# Patient Record
Sex: Female | Born: 1975 | Race: White | Hispanic: No | Marital: Single | Smoking: Former smoker
Health system: Southern US, Community
[De-identification: ages and names within clinical notes are randomized; demographics above are authoritative.]

## PROBLEM LIST (undated history)

## (undated) DIAGNOSIS — I10 Essential (primary) hypertension: Secondary | ICD-10-CM

## (undated) DIAGNOSIS — E079 Disorder of thyroid, unspecified: Secondary | ICD-10-CM

---

## 2003-01-18 ENCOUNTER — Ambulatory Visit (HOSPITAL_COMMUNITY): Admission: RE | Admit: 2003-01-18 | Discharge: 2003-01-18 | Payer: Self-pay | Admitting: Family Medicine

## 2003-12-06 ENCOUNTER — Ambulatory Visit (HOSPITAL_COMMUNITY): Admission: RE | Admit: 2003-12-06 | Discharge: 2003-12-06 | Payer: Self-pay | Admitting: Family Medicine

## 2009-02-21 ENCOUNTER — Emergency Department (HOSPITAL_COMMUNITY): Admission: EM | Admit: 2009-02-21 | Discharge: 2009-02-21 | Payer: Self-pay | Admitting: Emergency Medicine

## 2010-04-28 ENCOUNTER — Other Ambulatory Visit (HOSPITAL_COMMUNITY): Payer: Self-pay | Admitting: Otolaryngology

## 2010-04-28 ENCOUNTER — Ambulatory Visit (HOSPITAL_COMMUNITY)
Admission: RE | Admit: 2010-04-28 | Discharge: 2010-04-28 | Disposition: A | Discharge status: Home / Self Care | Payer: BC Managed Care – PPO | Source: Ambulatory Visit | Attending: Otolaryngology | Admitting: Otolaryngology

## 2010-04-28 DIAGNOSIS — R51 Headache: Secondary | ICD-10-CM

## 2010-04-28 DIAGNOSIS — R42 Dizziness and giddiness: Secondary | ICD-10-CM | POA: Insufficient documentation

## 2010-04-28 DIAGNOSIS — J392 Other diseases of pharynx: Secondary | ICD-10-CM | POA: Insufficient documentation

## 2010-04-28 DIAGNOSIS — R5381 Other malaise: Secondary | ICD-10-CM | POA: Insufficient documentation

## 2010-04-28 MED ORDER — GADOBENATE DIMEGLUMINE 529 MG/ML IV SOLN: 15.0000 mL | Freq: Once | INTRAVENOUS | Status: AC | PRN

## 2010-06-24 LAB — COMPREHENSIVE METABOLIC PANEL
ALT: 15 U/L (ref 0–35)
AST: 16 U/L (ref 0–37)
Albumin: 3.9 g/dL (ref 3.5–5.2)
Alkaline Phosphatase: 59 U/L (ref 39–117)
BUN: 10 mg/dL (ref 6–23)
CO2: 26 mEq/L (ref 19–32)
Calcium: 8.8 mg/dL (ref 8.4–10.5)
Chloride: 104 mEq/L (ref 96–112)
Creatinine, Ser: 0.98 mg/dL (ref 0.4–1.2)
GFR calc Af Amer: >60 mL/min (ref >60)
GFR calc non Af Amer: >60 mL/min (ref >60)
Glucose, Bld: 110 mg/dL — ABNORMAL HIGH (ref 70–99)
Potassium: 3.2 mEq/L — ABNORMAL LOW (ref 3.5–5.1)
Sodium: 141 mEq/L (ref 135–145)
Total Bilirubin: 0.2 mg/dL — ABNORMAL LOW (ref 0.3–1.2)
Total Protein: 7.4 g/dL (ref 6.0–8.3)

## 2010-06-24 LAB — DIFFERENTIAL
Basophils Absolute: 0.1 10*3/uL (ref 0.0–0.1)
Basophils Relative: 1 % (ref 0–1)
Eosinophils Absolute: 0.2 10*3/uL (ref 0.0–0.7)
Eosinophils Relative: 2 % (ref 0–5)
Lymphocytes Relative: 25 % (ref 12–46)
Lymphs Abs: 2.4 10*3/uL (ref 0.7–4.0)
Monocytes Absolute: 0.5 10*3/uL (ref 0.1–1.0)
Monocytes Relative: 5 % (ref 3–12)
Neutro Abs: 6.5 10*3/uL (ref 1.7–7.7)
Neutrophils Relative %: 67 % (ref 43–77)

## 2010-06-24 LAB — CBC
HCT: 38.1 % (ref 36.0–46.0)
Hemoglobin: 12.6 g/dL (ref 12.0–15.0)
MCHC: 33.2 g/dL (ref 30.0–36.0)
MCV: 81.5 fL (ref 78.0–100.0)
Platelets: 367 10*3/uL (ref 150–400)
RBC: 4.68 MIL/uL (ref 3.87–5.11)
RDW: 15.5 % (ref 11.5–15.5)
WBC: 9.7 10*3/uL (ref 4.0–10.5)

## 2010-06-24 LAB — URINALYSIS, ROUTINE W REFLEX MICROSCOPIC
Bilirubin Urine: NEGATIVE
Glucose, UA: NEGATIVE mg/dL
Hgb urine dipstick: NEGATIVE
Ketones, ur: NEGATIVE mg/dL
Nitrite: NEGATIVE
Protein, ur: NEGATIVE mg/dL
Specific Gravity, Urine: 1.013 (ref 1.005–1.030)
Urobilinogen, UA: 0.2 mg/dL (ref 0.0–1.0)
pH: 6 (ref 5.0–8.0)

## 2010-06-24 LAB — POCT CARDIAC MARKERS
CKMB, poc: <1 ng/mL — ABNORMAL LOW (ref 1.0–8.0)
Myoglobin, poc: 60.7 ng/mL (ref 12–200)
Troponin i, poc: <0.05 ng/mL (ref 0.00–0.09)

## 2010-06-24 LAB — POCT PREGNANCY, URINE: Preg Test, Ur: NEGATIVE

## 2010-06-24 LAB — LIPASE, BLOOD: Lipase: 35 U/L (ref 11–59)

## 2010-07-18 ENCOUNTER — Other Ambulatory Visit (HOSPITAL_COMMUNITY): Payer: Self-pay | Admitting: Family Medicine

## 2010-07-18 DIAGNOSIS — N760 Acute vaginitis: Secondary | ICD-10-CM

## 2010-07-18 DIAGNOSIS — N926 Irregular menstruation, unspecified: Secondary | ICD-10-CM

## 2010-07-22 ENCOUNTER — Ambulatory Visit (HOSPITAL_COMMUNITY): Admission: RE | Admit: 2010-07-22 | Payer: BC Managed Care – PPO | Source: Ambulatory Visit

## 2010-07-22 ENCOUNTER — Ambulatory Visit (HOSPITAL_COMMUNITY): Payer: BC Managed Care – PPO

## 2015-02-26 ENCOUNTER — Other Ambulatory Visit: Payer: Self-pay | Admitting: Obstetrics and Gynecology

## 2015-02-26 DIAGNOSIS — R928 Other abnormal and inconclusive findings on diagnostic imaging of breast: Secondary | ICD-10-CM

## 2015-03-04 ENCOUNTER — Ambulatory Visit
Admission: RE | Admit: 2015-03-04 | Discharge: 2015-03-04 | Disposition: A | Discharge status: Home / Self Care | Payer: BLUE CROSS/BLUE SHIELD | Source: Ambulatory Visit | Attending: Obstetrics and Gynecology | Admitting: Obstetrics and Gynecology

## 2015-03-04 ENCOUNTER — Other Ambulatory Visit: Payer: Self-pay | Admitting: Obstetrics and Gynecology

## 2015-03-04 DIAGNOSIS — R928 Other abnormal and inconclusive findings on diagnostic imaging of breast: Secondary | ICD-10-CM

## 2015-03-05 ENCOUNTER — Other Ambulatory Visit: Payer: Self-pay | Admitting: Obstetrics and Gynecology

## 2015-03-05 DIAGNOSIS — R928 Other abnormal and inconclusive findings on diagnostic imaging of breast: Secondary | ICD-10-CM

## 2015-03-07 ENCOUNTER — Ambulatory Visit
Admission: RE | Admit: 2015-03-07 | Discharge: 2015-03-07 | Disposition: A | Discharge status: Home / Self Care | Payer: BLUE CROSS/BLUE SHIELD | Source: Ambulatory Visit | Attending: Obstetrics and Gynecology | Admitting: Obstetrics and Gynecology

## 2015-03-07 DIAGNOSIS — R928 Other abnormal and inconclusive findings on diagnostic imaging of breast: Secondary | ICD-10-CM

## 2015-03-14 ENCOUNTER — Other Ambulatory Visit: Payer: BLUE CROSS/BLUE SHIELD

## 2015-12-02 ENCOUNTER — Other Ambulatory Visit: Payer: Self-pay | Admitting: Obstetrics and Gynecology

## 2015-12-02 DIAGNOSIS — N6320 Unspecified lump in the left breast, unspecified quadrant: Secondary | ICD-10-CM

## 2015-12-10 ENCOUNTER — Ambulatory Visit
Admission: RE | Admit: 2015-12-10 | Discharge: 2015-12-10 | Disposition: A | Discharge status: Home / Self Care | Payer: BLUE CROSS/BLUE SHIELD | Source: Ambulatory Visit | Attending: Obstetrics and Gynecology | Admitting: Obstetrics and Gynecology

## 2015-12-10 DIAGNOSIS — N6320 Unspecified lump in the left breast, unspecified quadrant: Secondary | ICD-10-CM

## 2018-06-27 DIAGNOSIS — J329 Chronic sinusitis, unspecified: Secondary | ICD-10-CM | POA: Diagnosis not present

## 2018-06-27 DIAGNOSIS — E663 Overweight: Secondary | ICD-10-CM | POA: Diagnosis not present

## 2018-06-27 DIAGNOSIS — Z6827 Body mass index (BMI) 27.0-27.9, adult: Secondary | ICD-10-CM | POA: Diagnosis not present

## 2018-07-06 DIAGNOSIS — Z23 Encounter for immunization: Secondary | ICD-10-CM | POA: Diagnosis not present

## 2018-07-06 DIAGNOSIS — R7989 Other specified abnormal findings of blood chemistry: Secondary | ICD-10-CM | POA: Diagnosis not present

## 2018-07-06 DIAGNOSIS — Z01419 Encounter for gynecological examination (general) (routine) without abnormal findings: Secondary | ICD-10-CM | POA: Diagnosis not present

## 2018-07-06 DIAGNOSIS — Z6829 Body mass index (BMI) 29.0-29.9, adult: Secondary | ICD-10-CM | POA: Diagnosis not present

## 2019-07-01 DIAGNOSIS — U071 COVID-19: Secondary | ICD-10-CM | POA: Diagnosis not present

## 2019-07-01 DIAGNOSIS — Z20822 Contact with and (suspected) exposure to covid-19: Secondary | ICD-10-CM | POA: Diagnosis not present

## 2019-07-26 DIAGNOSIS — L920 Granuloma annulare: Secondary | ICD-10-CM | POA: Diagnosis not present

## 2019-07-26 DIAGNOSIS — C44519 Basal cell carcinoma of skin of other part of trunk: Secondary | ICD-10-CM | POA: Diagnosis not present

## 2019-08-29 DIAGNOSIS — Z1231 Encounter for screening mammogram for malignant neoplasm of breast: Secondary | ICD-10-CM | POA: Diagnosis not present

## 2019-08-29 DIAGNOSIS — N915 Oligomenorrhea, unspecified: Secondary | ICD-10-CM | POA: Diagnosis not present

## 2019-08-29 DIAGNOSIS — Z01419 Encounter for gynecological examination (general) (routine) without abnormal findings: Secondary | ICD-10-CM | POA: Diagnosis not present

## 2019-08-29 DIAGNOSIS — R7989 Other specified abnormal findings of blood chemistry: Secondary | ICD-10-CM | POA: Diagnosis not present

## 2019-08-29 DIAGNOSIS — Z6829 Body mass index (BMI) 29.0-29.9, adult: Secondary | ICD-10-CM | POA: Diagnosis not present

## 2019-08-29 DIAGNOSIS — E039 Hypothyroidism, unspecified: Secondary | ICD-10-CM | POA: Diagnosis not present

## 2019-12-28 DIAGNOSIS — Z85828 Personal history of other malignant neoplasm of skin: Secondary | ICD-10-CM | POA: Diagnosis not present

## 2019-12-28 DIAGNOSIS — L920 Granuloma annulare: Secondary | ICD-10-CM | POA: Diagnosis not present

## 2019-12-28 DIAGNOSIS — Z08 Encounter for follow-up examination after completed treatment for malignant neoplasm: Secondary | ICD-10-CM | POA: Diagnosis not present

## 2020-02-21 DIAGNOSIS — M7731 Calcaneal spur, right foot: Secondary | ICD-10-CM | POA: Diagnosis not present

## 2020-02-21 DIAGNOSIS — M7732 Calcaneal spur, left foot: Secondary | ICD-10-CM | POA: Diagnosis not present

## 2020-02-21 DIAGNOSIS — M722 Plantar fascial fibromatosis: Secondary | ICD-10-CM | POA: Diagnosis not present

## 2020-02-28 DIAGNOSIS — M722 Plantar fascial fibromatosis: Secondary | ICD-10-CM | POA: Diagnosis not present

## 2020-03-06 DIAGNOSIS — M71572 Other bursitis, not elsewhere classified, left ankle and foot: Secondary | ICD-10-CM | POA: Diagnosis not present

## 2020-03-06 DIAGNOSIS — M71571 Other bursitis, not elsewhere classified, right ankle and foot: Secondary | ICD-10-CM | POA: Diagnosis not present

## 2020-03-06 DIAGNOSIS — M722 Plantar fascial fibromatosis: Secondary | ICD-10-CM | POA: Diagnosis not present

## 2020-03-11 DIAGNOSIS — R519 Headache, unspecified: Secondary | ICD-10-CM | POA: Diagnosis not present

## 2020-03-11 DIAGNOSIS — R0981 Nasal congestion: Secondary | ICD-10-CM | POA: Diagnosis not present

## 2020-03-11 DIAGNOSIS — Z20822 Contact with and (suspected) exposure to covid-19: Secondary | ICD-10-CM | POA: Diagnosis not present

## 2020-03-11 DIAGNOSIS — R42 Dizziness and giddiness: Secondary | ICD-10-CM | POA: Diagnosis not present

## 2020-04-03 DIAGNOSIS — Z03818 Encounter for observation for suspected exposure to other biological agents ruled out: Secondary | ICD-10-CM | POA: Diagnosis not present

## 2020-04-11 DIAGNOSIS — J069 Acute upper respiratory infection, unspecified: Secondary | ICD-10-CM | POA: Diagnosis not present

## 2020-04-18 DIAGNOSIS — Z03818 Encounter for observation for suspected exposure to other biological agents ruled out: Secondary | ICD-10-CM | POA: Diagnosis not present

## 2020-04-23 DIAGNOSIS — Z03818 Encounter for observation for suspected exposure to other biological agents ruled out: Secondary | ICD-10-CM | POA: Diagnosis not present

## 2020-06-13 DIAGNOSIS — L578 Other skin changes due to chronic exposure to nonionizing radiation: Secondary | ICD-10-CM | POA: Diagnosis not present

## 2020-06-13 DIAGNOSIS — D485 Neoplasm of uncertain behavior of skin: Secondary | ICD-10-CM | POA: Diagnosis not present

## 2020-06-13 DIAGNOSIS — D2262 Melanocytic nevi of left upper limb, including shoulder: Secondary | ICD-10-CM | POA: Diagnosis not present

## 2020-06-13 DIAGNOSIS — L818 Other specified disorders of pigmentation: Secondary | ICD-10-CM | POA: Diagnosis not present

## 2020-06-13 DIAGNOSIS — L308 Other specified dermatitis: Secondary | ICD-10-CM | POA: Diagnosis not present

## 2020-07-11 DIAGNOSIS — D485 Neoplasm of uncertain behavior of skin: Secondary | ICD-10-CM | POA: Diagnosis not present

## 2020-09-11 DIAGNOSIS — N920 Excessive and frequent menstruation with regular cycle: Secondary | ICD-10-CM | POA: Insufficient documentation

## 2020-09-13 DIAGNOSIS — E038 Other specified hypothyroidism: Secondary | ICD-10-CM | POA: Insufficient documentation

## 2020-09-13 DIAGNOSIS — Z1231 Encounter for screening mammogram for malignant neoplasm of breast: Secondary | ICD-10-CM | POA: Diagnosis not present

## 2020-09-13 DIAGNOSIS — Z6831 Body mass index (BMI) 31.0-31.9, adult: Secondary | ICD-10-CM | POA: Diagnosis not present

## 2020-09-13 DIAGNOSIS — N959 Unspecified menopausal and perimenopausal disorder: Secondary | ICD-10-CM | POA: Diagnosis not present

## 2020-09-13 DIAGNOSIS — Z01419 Encounter for gynecological examination (general) (routine) without abnormal findings: Secondary | ICD-10-CM | POA: Diagnosis not present

## 2020-09-13 DIAGNOSIS — N951 Menopausal and female climacteric states: Secondary | ICD-10-CM | POA: Insufficient documentation

## 2020-09-13 DIAGNOSIS — E039 Hypothyroidism, unspecified: Secondary | ICD-10-CM | POA: Diagnosis not present

## 2020-09-13 DIAGNOSIS — Z124 Encounter for screening for malignant neoplasm of cervix: Secondary | ICD-10-CM | POA: Diagnosis not present

## 2020-09-13 DIAGNOSIS — Z01411 Encounter for gynecological examination (general) (routine) with abnormal findings: Secondary | ICD-10-CM | POA: Diagnosis not present

## 2020-09-26 DIAGNOSIS — Z1212 Encounter for screening for malignant neoplasm of rectum: Secondary | ICD-10-CM | POA: Diagnosis not present

## 2020-09-26 DIAGNOSIS — Z1211 Encounter for screening for malignant neoplasm of colon: Secondary | ICD-10-CM | POA: Diagnosis not present

## 2020-10-06 LAB — EXTERNAL GENERIC LAB PROCEDURE: COLOGUARD: NEGATIVE

## 2020-10-16 DIAGNOSIS — Z20828 Contact with and (suspected) exposure to other viral communicable diseases: Secondary | ICD-10-CM | POA: Diagnosis not present

## 2020-10-16 DIAGNOSIS — I10 Essential (primary) hypertension: Secondary | ICD-10-CM | POA: Diagnosis not present

## 2020-10-16 DIAGNOSIS — Z681 Body mass index (BMI) 19 or less, adult: Secondary | ICD-10-CM | POA: Diagnosis not present

## 2020-10-16 DIAGNOSIS — J019 Acute sinusitis, unspecified: Secondary | ICD-10-CM | POA: Diagnosis not present

## 2020-10-30 DIAGNOSIS — E038 Other specified hypothyroidism: Secondary | ICD-10-CM | POA: Diagnosis not present

## 2020-10-30 DIAGNOSIS — I10 Essential (primary) hypertension: Secondary | ICD-10-CM | POA: Diagnosis not present

## 2020-10-30 DIAGNOSIS — Z13220 Encounter for screening for lipoid disorders: Secondary | ICD-10-CM | POA: Diagnosis not present

## 2020-10-30 DIAGNOSIS — Z720 Tobacco use: Secondary | ICD-10-CM | POA: Diagnosis not present

## 2020-10-30 DIAGNOSIS — Z1389 Encounter for screening for other disorder: Secondary | ICD-10-CM | POA: Diagnosis not present

## 2020-10-30 DIAGNOSIS — Z0001 Encounter for general adult medical examination with abnormal findings: Secondary | ICD-10-CM | POA: Diagnosis not present

## 2020-11-05 DIAGNOSIS — I10 Essential (primary) hypertension: Secondary | ICD-10-CM | POA: Diagnosis not present

## 2020-12-29 ENCOUNTER — Other Ambulatory Visit: Payer: Self-pay

## 2020-12-29 ENCOUNTER — Emergency Department (HOSPITAL_COMMUNITY)
Admission: EM | Admit: 2020-12-29 | Discharge: 2020-12-29 | Disposition: A | Discharge status: Home / Self Care | Payer: BC Managed Care – PPO | Attending: Emergency Medicine | Admitting: Emergency Medicine

## 2020-12-29 ENCOUNTER — Emergency Department (HOSPITAL_COMMUNITY): Payer: BC Managed Care – PPO

## 2020-12-29 ENCOUNTER — Encounter (HOSPITAL_COMMUNITY): Payer: Self-pay

## 2020-12-29 DIAGNOSIS — S0003XA Contusion of scalp, initial encounter: Secondary | ICD-10-CM | POA: Insufficient documentation

## 2020-12-29 DIAGNOSIS — N9489 Other specified conditions associated with female genital organs and menstrual cycle: Secondary | ICD-10-CM | POA: Insufficient documentation

## 2020-12-29 DIAGNOSIS — F17210 Nicotine dependence, cigarettes, uncomplicated: Secondary | ICD-10-CM | POA: Insufficient documentation

## 2020-12-29 DIAGNOSIS — Y92009 Unspecified place in unspecified non-institutional (private) residence as the place of occurrence of the external cause: Secondary | ICD-10-CM | POA: Diagnosis not present

## 2020-12-29 DIAGNOSIS — E876 Hypokalemia: Secondary | ICD-10-CM | POA: Diagnosis not present

## 2020-12-29 DIAGNOSIS — W1839XA Other fall on same level, initial encounter: Secondary | ICD-10-CM | POA: Diagnosis not present

## 2020-12-29 DIAGNOSIS — R55 Syncope and collapse: Secondary | ICD-10-CM | POA: Diagnosis not present

## 2020-12-29 DIAGNOSIS — S069X9A Unspecified intracranial injury with loss of consciousness of unspecified duration, initial encounter: Secondary | ICD-10-CM | POA: Diagnosis not present

## 2020-12-29 HISTORY — DX: Essential (primary) hypertension: I10

## 2020-12-29 HISTORY — DX: Disorder of thyroid, unspecified: E07.9

## 2020-12-29 LAB — CBC WITH DIFFERENTIAL/PLATELET
Abs Immature Granulocytes: 0.05 10*3/uL (ref 0.00–0.07)
Basophils Absolute: 0.1 10*3/uL (ref 0.0–0.1)
Basophils Relative: 1 %
Eosinophils Absolute: 0.5 10*3/uL (ref 0.0–0.5)
Eosinophils Relative: 4 %
HCT: 46.4 % — ABNORMAL HIGH (ref 36.0–46.0)
Hemoglobin: 15.8 g/dL — ABNORMAL HIGH (ref 12.0–15.0)
Immature Granulocytes: 0 %
Lymphocytes Relative: 21 %
Lymphs Abs: 3 10*3/uL (ref 0.7–4.0)
MCH: 31.5 pg (ref 26.0–34.0)
MCHC: 34.1 g/dL (ref 30.0–36.0)
MCV: 92.4 fL (ref 80.0–100.0)
Monocytes Absolute: 0.7 10*3/uL (ref 0.1–1.0)
Monocytes Relative: 5 %
Neutro Abs: 9.6 10*3/uL — ABNORMAL HIGH (ref 1.7–7.7)
Neutrophils Relative %: 69 %
Platelets: 345 10*3/uL (ref 150–400)
RBC: 5.02 MIL/uL (ref 3.87–5.11)
RDW: 12.4 % (ref 11.5–15.5)
WBC: 14 10*3/uL — ABNORMAL HIGH (ref 4.0–10.5)
nRBC: 0 % (ref 0.0–0.2)

## 2020-12-29 LAB — MAGNESIUM: Magnesium: 2 mg/dL (ref 1.7–2.4)

## 2020-12-29 LAB — COMPREHENSIVE METABOLIC PANEL
ALT: 21 U/L (ref 0–44)
AST: 18 U/L (ref 15–41)
Albumin: 4.1 g/dL (ref 3.5–5.0)
Alkaline Phosphatase: 62 U/L (ref 38–126)
Anion gap: 9 (ref 5–15)
BUN: 14 mg/dL (ref 6–20)
CO2: 26 mmol/L (ref 22–32)
Calcium: 8.5 mg/dL — ABNORMAL LOW (ref 8.9–10.3)
Chloride: 102 mmol/L (ref 98–111)
Creatinine, Ser: 0.72 mg/dL (ref 0.44–1.00)
GFR, Estimated: >60 mL/min (ref >60)
Glucose, Bld: 129 mg/dL — ABNORMAL HIGH (ref 70–99)
Potassium: 3 mmol/L — ABNORMAL LOW (ref 3.5–5.1)
Sodium: 137 mmol/L (ref 135–145)
Total Bilirubin: 0.6 mg/dL (ref 0.3–1.2)
Total Protein: 7.1 g/dL (ref 6.5–8.1)

## 2020-12-29 LAB — I-STAT BETA HCG BLOOD, ED (MC, WL, AP ONLY): I-stat hCG, quantitative: <5 m[IU]/mL (ref <5)

## 2020-12-29 MED ORDER — LACTATED RINGERS IV BOLUS
1000.0000 mL | Freq: Once | INTRAVENOUS | Status: AC
  Administered 2020-12-29: 1000 mL via INTRAVENOUS

## 2020-12-29 MED ORDER — POTASSIUM CHLORIDE CRYS ER 20 MEQ PO TBCR: 40.0000 meq | EXTENDED_RELEASE_TABLET | Freq: Every day | ORAL | 0 refills | Status: DC

## 2020-12-29 MED ORDER — MAGNESIUM SULFATE 2 GM/50ML IV SOLN
2.0000 g | Freq: Once | INTRAVENOUS | Status: AC
  Administered 2020-12-29: 2 g via INTRAVENOUS
  Filled 2020-12-29 (×2): qty 50

## 2020-12-29 MED ORDER — POTASSIUM CHLORIDE CRYS ER 20 MEQ PO TBCR
80.0000 meq | EXTENDED_RELEASE_TABLET | Freq: Once | ORAL | Status: AC
  Administered 2020-12-29: 80 meq via ORAL
  Filled 2020-12-29: qty 4

## 2020-12-29 MED ORDER — POTASSIUM CHLORIDE 10 MEQ/100ML IV SOLN
10.0000 meq | Freq: Once | INTRAVENOUS | Status: AC
  Administered 2020-12-29: 10 meq via INTRAVENOUS
  Filled 2020-12-29: qty 100

## 2020-12-29 NOTE — ED Triage Notes (Signed)
Pt arrived via POV. States that she felt nauseous when she arose to get some zofran she went out. Her husband states that she was unresponsive for approx 5-6 min, Noted to have a knot on back of her head from fall.

## 2020-12-29 NOTE — ED Notes (Signed)
Pt ambulated around nursing station without issue

## 2020-12-29 NOTE — ED Provider Notes (Signed)
[facility] EMERGENCY DEPARTMENT Provider Note   CSN: [id number] Arrival date & time: 12/29/20  0207     History Chief Complaint  Patient presents with   Loss of Consciousness    Virginia Nichols is a 45 y.o. female.  Husband heard a crash and went out fo find her laid out on the floor.  She is unresponsive.  She is breathing okay.  She did have her eyes rolled back of her head when he opened them.  However she did regain consciousness pretty quickly and subsequently was back to her self.  She complained of a headache from where she hit her head but she does not remember anything prior to the event besides feeling nauseous. No chest pain, abdominal pain, headache, vision change or other events. Did feel slightly light headed. No recent illnesses. Eating/drinking normally. Never had this problem before. Ems bp was 144/100, cbg of 106, HR in 90's per husband report. Came in via POV.    Loss of Consciousness Episode history:  Single Most recent episode:  Today Timing:  Rare Progression:  Resolved Chronicity:  New Context: standing up   Context: not blood draw, not exertion and not sight of blood   Witnessed: no   Relieved by:  None tried Worsened by:  Nothing Ineffective treatments:  None tried Associated symptoms: no anxiety and no chest pain       Past Medical History:  Diagnosis Date   Hypertension    Thyroid disease     There are no problems to display for this patient.   History reviewed. No pertinent surgical history.   OB History   No obstetric history on file.     History reviewed. No pertinent family history.  Social History   Tobacco Use   Smoking status: Every Day    Packs/day: 1.00    Years: 25.00    Pack years: 25.00    Types: Cigarettes   Smokeless tobacco: Never    Home Medications Prior to Admission medications   Medication Sig Start Date End Date Taking? Authorizing Provider  potassium chloride SA (KLOR-CON) 20 MEQ tablet Take 2  tablets (40 mEq total) by mouth daily for 7 days. 12/29/20 01/05/21 Yes Tamara Kenyon, Corene Cornea, MD    Allergies    Patient has no allergy information on record.  Review of Systems   Review of Systems  Cardiovascular:  Positive for syncope. Negative for chest pain.  All other systems reviewed and are negative.  Physical Exam Updated Vital Signs BP 106/67   Pulse 64   Temp 98.4 F (36.9 C)   Resp 12   Ht 5' 6"  (1.676 m)   Wt 80.7 kg   LMP 04/06/2010   SpO2 98%   BMI 28.73 kg/m   Physical Exam Vitals and nursing note reviewed.  Constitutional:      Appearance: She is well-developed.  HENT:     Head: Normocephalic.     Comments: Hematoma to posterior scalp    Mouth/Throat:     Mouth: Mucous membranes are moist.     Pharynx: Oropharynx is clear.  Eyes:     Pupils: Pupils are equal, round, and reactive to light.  Cardiovascular:     Rate and Rhythm: Normal rate and regular rhythm.  Pulmonary:     Effort: No respiratory distress.     Breath sounds: No stridor.  Abdominal:     General: There is no distension.  Musculoskeletal:     Cervical back: Normal range of motion.  Neurological:     Mental Status: She is alert.    ED Results / Procedures / Treatments   Labs (all labs ordered are listed, but only abnormal results are displayed) Labs Reviewed  CBC WITH DIFFERENTIAL/PLATELET - Abnormal; Notable for the following components:      Result Value   WBC 14.0 (*)    Hemoglobin 15.8 (*)    HCT 46.4 (*)    Neutro Abs 9.6 (*)    All other components within normal limits  COMPREHENSIVE METABOLIC PANEL - Abnormal; Notable for the following components:   Potassium 3.0 (*)    Glucose, Bld 129 (*)    Calcium 8.5 (*)    All other components within normal limits  MAGNESIUM  I-STAT BETA HCG BLOOD, ED ([facility], [facility], [facility] ONLY)  POC URINE PREG, ED    EKG None  Radiology CT HEAD WO CONTRAST  Result Date: 12/29/2020 CLINICAL DATA:  Syncope EXAM: CT HEAD WITHOUT CONTRAST TECHNIQUE:  Contiguous axial images were obtained from the base of the skull through the vertex without intravenous contrast. COMPARISON:  None. FINDINGS: Brain: There is no mass, hemorrhage or extra-axial collection. The size and configuration of the ventricles and extra-axial CSF spaces are normal. The brain parenchyma is normal, without acute or chronic infarction. Vascular: No abnormal hyperdensity of the major intracranial arteries or dural venous sinuses. No intracranial atherosclerosis. Skull: Right parietal scalp hematoma.  No skull fracture. Sinuses/Orbits: No fluid levels or advanced mucosal thickening of the visualized paranasal sinuses. No mastoid or middle ear effusion. The orbits are normal. IMPRESSION: 1. No acute intracranial abnormality. 2. Right parietal scalp hematoma without skull fracture. Electronically Signed   By: Ulyses Jarred M.D.   On: 12/29/2020 03:19   DG Chest Port 1 View  Result Date: 12/29/2020 CLINICAL DATA:  Syncope EXAM: PORTABLE CHEST 1 VIEW COMPARISON:  02/21/2009 FINDINGS: The heart size and mediastinal contours are within normal limits. Both lungs are clear. The visualized skeletal structures are unremarkable. IMPRESSION: No active disease. Electronically Signed   By: Ulyses Jarred M.D.   On: 12/29/2020 03:12    Procedures Procedures   Medications Ordered in ED Medications  lactated ringers bolus 1,000 mL (0 mLs Intravenous Stopped 12/29/20 0455)  potassium chloride SA (KLOR-CON) CR tablet 80 mEq (80 mEq Oral Given 12/29/20 0432)  potassium chloride 10 mEq in 100 mL IVPB (0 mEq Intravenous Stopped 12/29/20 0605)  lactated ringers bolus 1,000 mL (0 mLs Intravenous Stopped 12/29/20 0605)  magnesium sulfate IVPB 2 g 50 mL (0 g Intravenous Stopped 12/29/20 0604)    ED Course  I have reviewed the triage vital signs and the nursing notes.  Pertinent labs & imaging results that were available during my care of the patient were reviewed by me and considered in my medical decision  making (see chart for details).    MDM Rules/Calculators/A&P                         Sounds vasovagal, doubt seizure without any of the common factors.   Found to have low K, and hemoconcentrated cbc. Will continue fluids, add on Mg and K.   Vital signs stable.  Potassium is repleted.  Patient able ambulate without difficulty or syncope.  Also had vital signs are okay.  Patient will continue to replete potassium at home.  Follow-up with her PCP for further work-up of her syncope and recheck potassium  Final Clinical Impression(s) / ED Diagnoses Final diagnoses:  Hypokalemia  Syncope, unspecified syncope type    Rx / DC Orders ED Discharge Orders          Ordered    potassium chloride SA (KLOR-CON) 20 MEQ tablet  Daily        12/29/20 0613             Ascencion Coye, Corene Cornea, MD 12/29/20 [phone]

## 2020-12-29 NOTE — ED Notes (Signed)
Patient transported to CT 

## 2022-05-12 IMAGING — CT CT HEAD W/O CM
3 series · 16 of 47 positions shown, 19 images · non-contrast
Comparison: None.

CLINICAL DATA: Syncope

EXAM:
CT HEAD WITHOUT CONTRAST
TECHNIQUE: Contiguous axial images were obtained from the base of the skull
through the vertex without intravenous contrast.

[Series 2: head w o · axial · 0.43mm/px · z∈[+107,+237]mm · 10 of 32 slices shown, 13 images]
[im 3/32  brain]
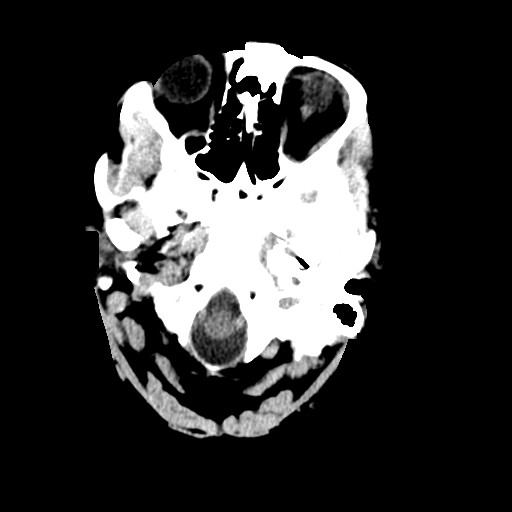
[im 3/32  bone]
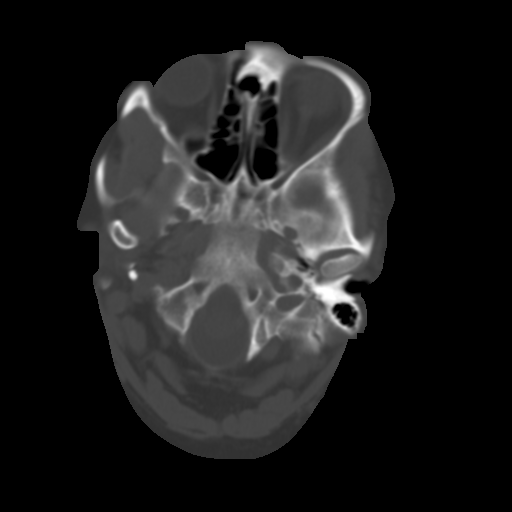
[im 6/32  brain]
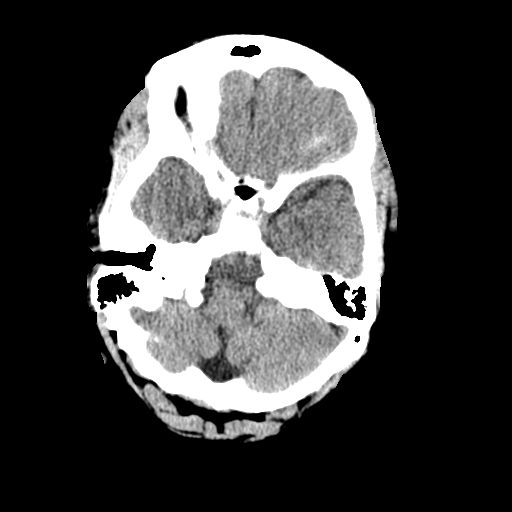
[im 9/32  brain]
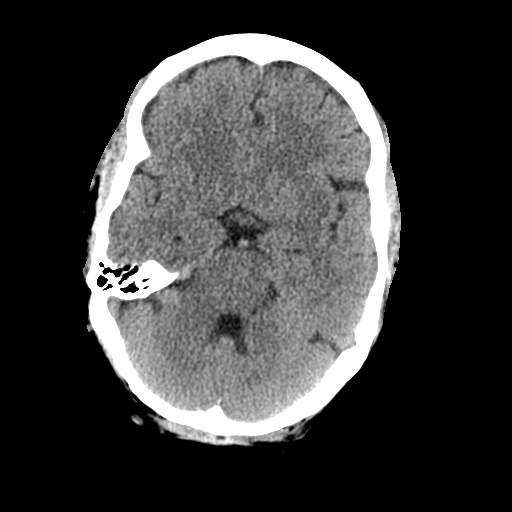
[im 11/32  brain]
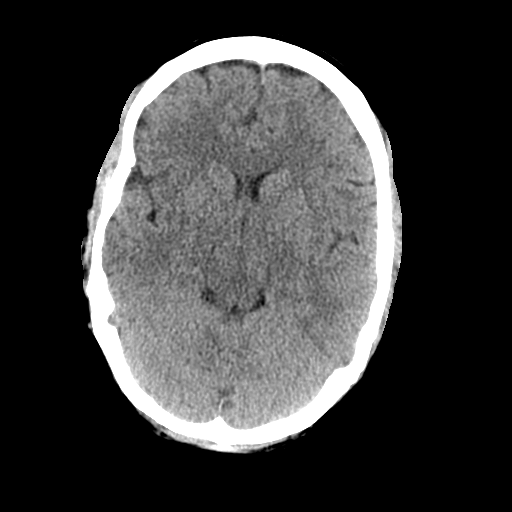
[im 14/32  brain]
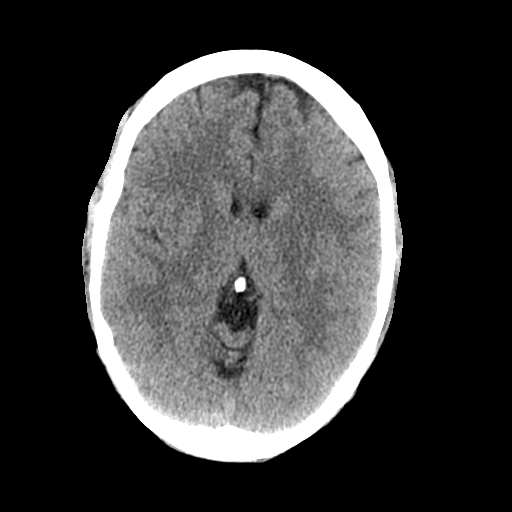
[im 14/32  bone]
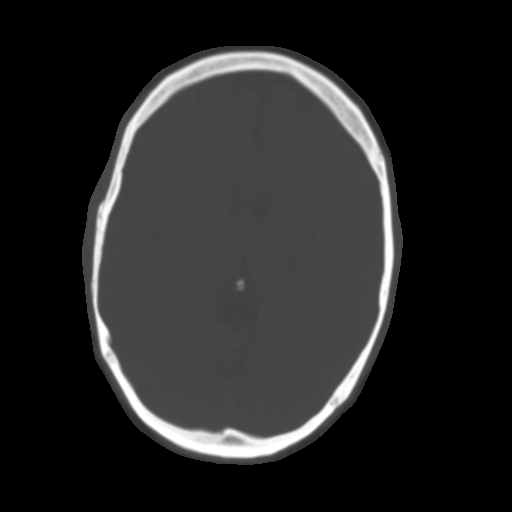
[im 18/32  brain]
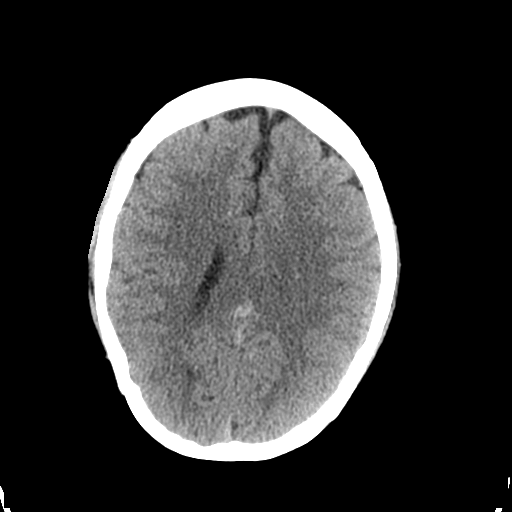
[im 21/32  brain]
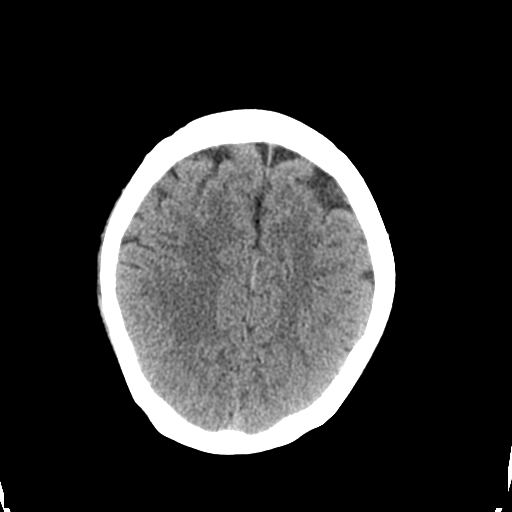
[im 24/32  brain]
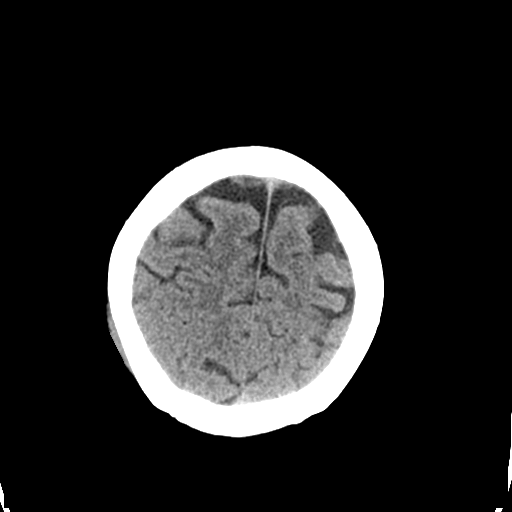
[im 26/32  brain]
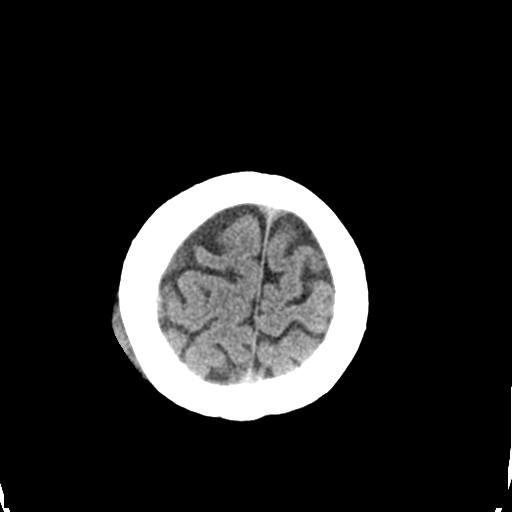
[im 26/32  bone]
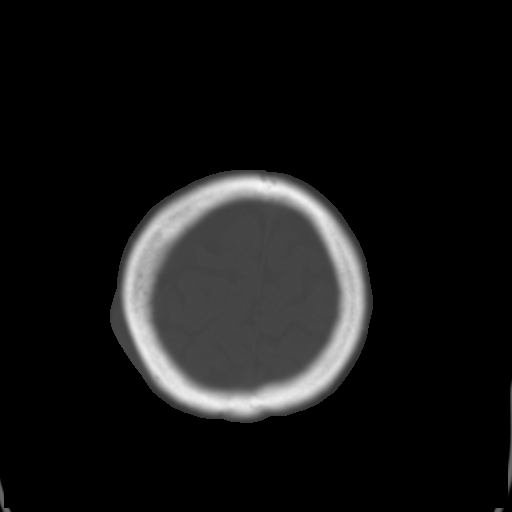
[im 29/32  brain]
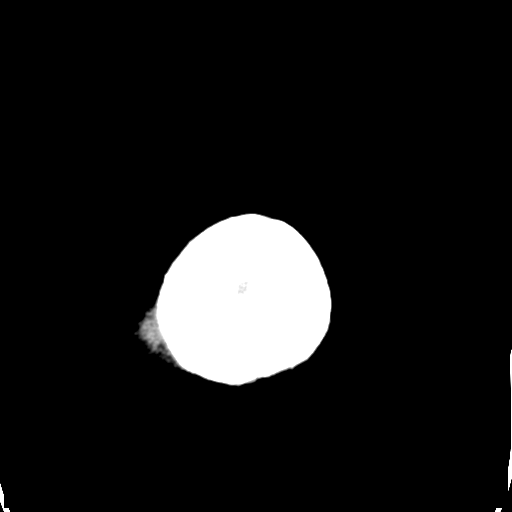

[Series 4: coronal soft · coronal · 0.30mm/px · 3 of 62 slices shown]
[im 21/62  brain]
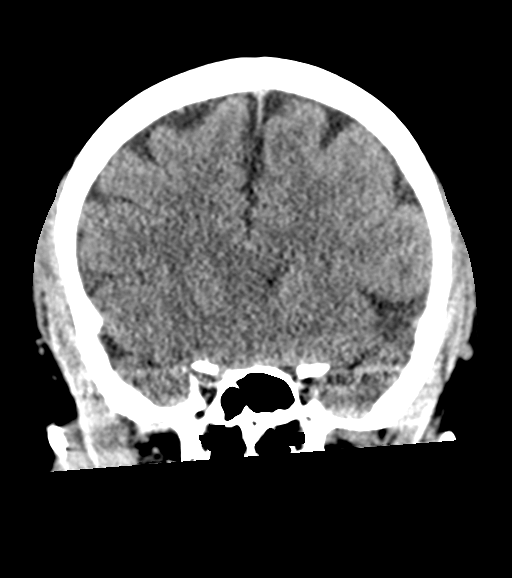
[im 28/62  brain]
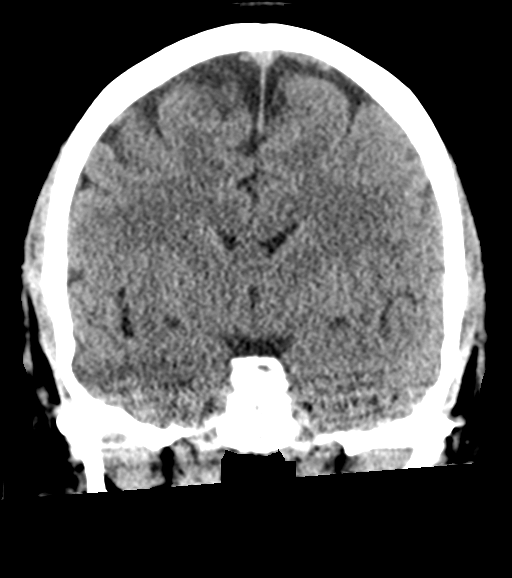
[im 34/62  brain]
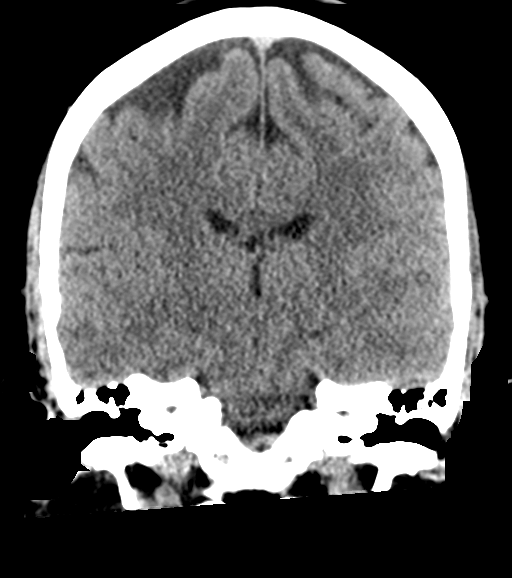

[Series 5: sagittal soft · sagittal · 0.34mm/px · 3 of 52 slices shown]
[im 18/52  brain]
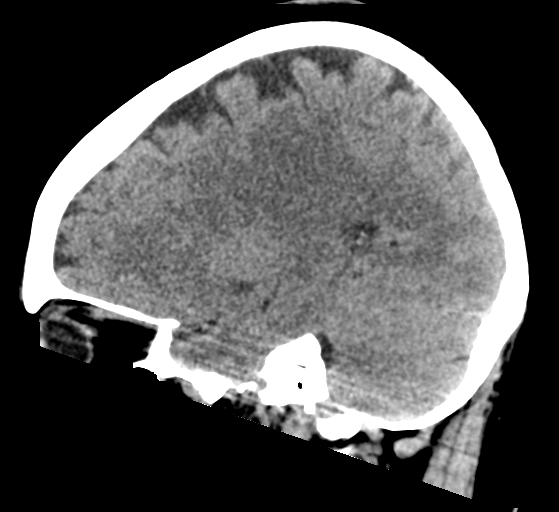
[im 26/52  brain]
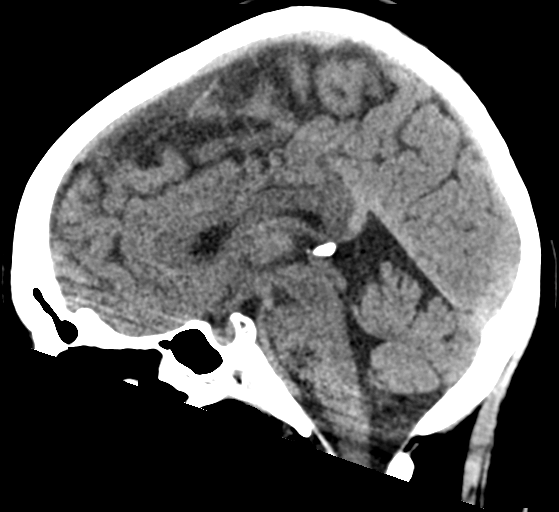
[im 35/52  brain]
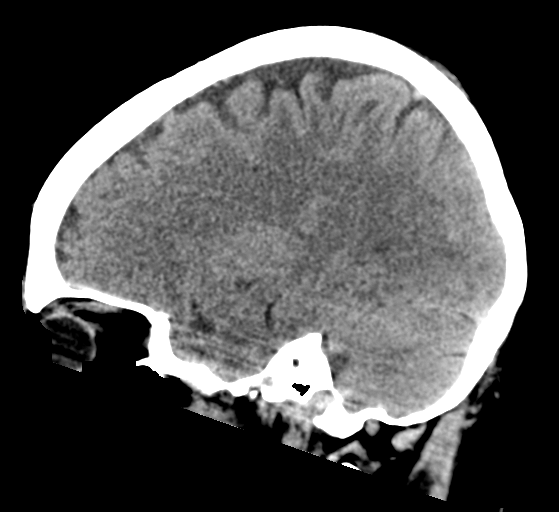

[16 of 47 positions shown; findings below may reference images not displayed]

FINDINGS: Brain: There is no mass, hemorrhage or extra-axial collection. The
size and configuration of the ventricles and extra-axial CSF spaces
are normal. The brain parenchyma is normal, without acute or chronic
infarction.

Vascular: No abnormal hyperdensity of the major intracranial
arteries or dural venous sinuses. No intracranial atherosclerosis.

Skull: Right parietal scalp hematoma.  No skull fracture.

Sinuses/Orbits: No fluid levels or advanced mucosal thickening of
the visualized paranasal sinuses. No mastoid or middle ear effusion.
The orbits are normal.
IMPRESSION: 1. No acute intracranial abnormality.
2. Right parietal scalp hematoma without skull fracture.

## 2023-02-23 ENCOUNTER — Ambulatory Visit
Admission: RE | Admit: 2023-02-23 | Discharge: 2023-02-23 | Disposition: A | Discharge status: Home / Self Care | Payer: BC Managed Care – PPO | Source: Ambulatory Visit

## 2023-02-23 ENCOUNTER — Ambulatory Visit: Payer: Self-pay

## 2023-02-23 VITALS — HR 82 | Temp 98.8°F | Resp 18

## 2023-02-23 DIAGNOSIS — H6502 Acute serous otitis media, left ear: Secondary | ICD-10-CM

## 2023-02-23 DIAGNOSIS — J069 Acute upper respiratory infection, unspecified: Secondary | ICD-10-CM | POA: Diagnosis not present

## 2023-02-23 MED ORDER — PREDNISONE 10 MG (21) PO TBPK: ORAL_TABLET | Freq: Every day | ORAL | 0 refills | Status: DC

## 2023-02-23 MED ORDER — AMOXICILLIN-POT CLAVULANATE 875-125 MG PO TABS: 1.0000 | ORAL_TABLET | Freq: Two times a day (BID) | ORAL | 0 refills | Status: DC

## 2023-02-23 MED ORDER — PROMETHAZINE-DM 6.25-15 MG/5ML PO SYRP
5.0000 mL | ORAL_SOLUTION | Freq: Every evening | ORAL | 0 refills | Status: DC | PRN
Start: 2023-02-23 — End: 2023-05-25

## 2023-02-23 MED ORDER — BENZONATATE 100 MG PO CAPS: 100.0000 mg | ORAL_CAPSULE | Freq: Three times a day (TID) | ORAL | 0 refills | Status: DC

## 2023-02-23 NOTE — ED Provider Notes (Signed)
Renaldo Fiddler    CSN: [id number] Arrival date & time: 02/23/23  1228      History   Chief Complaint Chief Complaint  Patient presents with   Cough    Cough and d co getting.  Headache fatigue. Off and on sore throat - Entered by patient    HPI Virginia Nichols is a 47 y.o. female.   Patient presents for evaluation of a fever peaking at 100.4, nasal congestion, rhinorrhea, nonproductive cough, sore throat, shortness of breath at rest and wheezing present for 4 days.  Known sick contact with exposure to pneumonia.  COVID and flu testing completed by her job, negative.  Has attempted use of TheraFlu and DayQuil which has provided minimal relief.  Denies respiratory history.  Past Medical History:  Diagnosis Date   Hypertension    Thyroid disease     There are no problems to display for this patient.   History reviewed. No pertinent surgical history.  OB History   No obstetric history on file.      Home Medications    Prior to Admission medications   Medication Sig Start Date End Date Taking? Authorizing Provider  amoxicillin-clavulanate (AUGMENTIN) 875-125 MG tablet Take 1 tablet by mouth every 12 (twelve) hours. 02/23/23  Yes Alphonzo Devera R, NP  benzonatate (TESSALON) 100 MG capsule Take 1 capsule (100 mg total) by mouth every 8 (eight) hours. 02/23/23  Yes Arnold Depinto, Elita Boone, NP  levothyroxine (SYNTHROID) 50 MCG tablet Take 1 tablet by mouth daily. 11/05/21  Yes [provider]  montelukast (SINGULAIR) 10 MG tablet Take 1 tablet by mouth at bedtime. 01/22/23 01/22/24 Yes [provider]  predniSONE (STERAPRED UNI-PAK 21 TAB) 10 MG (21) TBPK tablet Take by mouth daily. Take 6 tabs by mouth daily  for 1 days, then 5 tabs for 1 days, then 4 tabs for 1 days, then 3 tabs for 1 days, 2 tabs for 1 days, then 1 tab by mouth daily for 1 days 02/23/23  Yes Jessicah Croll R, NP  promethazine-dextromethorphan (PROMETHAZINE-DM) 6.25-15 MG/5ML syrup Take 5  mLs by mouth at bedtime as needed for cough. 02/23/23  Yes Lynnette Pote R, NP  albuterol (VENTOLIN HFA) 108 (90 Base) MCG/ACT inhaler Inhale into the lungs. Patient not taking: Reported on 02/23/2023 01/22/23 01/22/24  [provider]  potassium chloride SA (KLOR-CON) 20 MEQ tablet Take 2 tablets (40 mEq total) by mouth daily for 7 days. Patient not taking: Reported on 02/23/2023 12/29/20 01/05/21  Mesner, Barbara Cower, MD    Family History History reviewed. No pertinent family history.  Social History Social History   Tobacco Use   Smoking status: Former    Current packs/day: 1.00    Average packs/day: 1 pack/day for 25.0 years (25.0 ttl pk-yrs)    Types: Cigarettes   Smokeless tobacco: Never  Substance Use Topics   Alcohol use: Never   Drug use: Never     Allergies   Patient has no known allergies.   Review of Systems Review of Systems   Physical Exam Triage Vital Signs ED Triage Vitals  Encounter Vitals Group     BP --      Systolic BP Percentile --      Diastolic BP Percentile --      Pulse Rate 02/23/23 1257 82     Resp 02/23/23 1257 18     Temp 02/23/23 1257 98.8 F (37.1 C)     Temp src --      SpO2  02/23/23 1257 96 %     Weight --      Height --      Head Circumference --      Peak Flow --      Pain Score 02/23/23 1249 4     Pain Loc --      Pain Education --      Exclude from Growth Chart --    No data found.  Updated Vital Signs Pulse 82   Temp 98.8 F (37.1 C)   Resp 18   LMP 04/06/2010   SpO2 96%   Visual Acuity Right Eye Distance:   Left Eye Distance:   Bilateral Distance:    Right Eye Near:   Left Eye Near:    Bilateral Near:     Physical Exam Constitutional:      Appearance: Normal appearance.  HENT:     Right Ear: Ear canal and external ear normal. There is impacted cerumen.     Left Ear: Ear canal and external ear normal. Tympanic membrane is erythematous.     Nose: Congestion present. No rhinorrhea.     Mouth/Throat:      Mouth: Mucous membranes are moist.     Pharynx: Oropharynx is clear. No oropharyngeal exudate or posterior oropharyngeal erythema.  Eyes:     Extraocular Movements: Extraocular movements intact.  Cardiovascular:     Rate and Rhythm: Normal rate and regular rhythm.     Pulses: Normal pulses.     Heart sounds: Normal heart sounds.  Pulmonary:     Effort: Pulmonary effort is normal.     Breath sounds: Normal breath sounds.  Musculoskeletal:     Cervical back: Normal range of motion and neck supple.  Neurological:     Mental Status: She is alert and oriented to person, place, and time. Mental status is at baseline.      UC Treatments / Results  Labs (all labs ordered are listed, but only abnormal results are displayed) Labs Reviewed - No data to display  EKG   Radiology No results found.  Procedures Procedures (including critical care time)  Medications Ordered in UC Medications - No data to display  Initial Impression / Assessment and Plan / UC Course  I have reviewed the triage vital signs and the nursing notes.  Pertinent labs & imaging results that were available during my care of the patient were reviewed by me and considered in my medical decision making (see chart for details).  Nonrecurrent acute serous otitis media of the left ear, acute URI  Patient is in no signs of distress nor toxic appearing.  Vital signs are stable.  Low suspicion for pneumonia, pneumothorax or bronchitis and therefore will defer imaging.  Infection noted to the left tympanic membrane therefore prescribed antibiotic, Augmentin sent to pharmacy.  Right ear impaction noted on exam, water irrigation completed by nursing staff, successful.  Additionally prescribed prednisone, Tessalon and Promethazine DM for management of cough, shortness of breath and wheezing.May use additional over-the-counter medications as needed for supportive care.  May follow-up with urgent care as needed if symptoms  persist or worsen.   Final Clinical Impressions(s) / UC Diagnoses   Final diagnoses:  Non-recurrent acute serous otitis media of left ear  Acute URI     Discharge Instructions      On exam your left ear is affected therefore you will receive an antibiotic today, begin Augmentin every morning and every evening for 7 days  Low suspicion that you have  pneumonia as your lungs are clear aside from a very mild wheeze to the right upper lung and you are getting enough air without assistance, wheezing is a sign of constriction and not of infection  For shortness of breath and wheezing begin prednisone every morning with food, take as directed to open and relax the airway  You may use Tessalon pill every 8 hours to help with coughing, may use cough syrup at bedtime for additional comfort  Right ear on exam is blocked completely by wax in water irrigation physical in completed by nursing staff to remove You can take Tylenol and/or Ibuprofen as needed for fever reduction and pain relief.   For cough: honey 1/2 to 1 teaspoon (you can dilute the honey in water or another fluid).  You can also use guaifenesin and dextromethorphan for cough. You can use a humidifier for chest congestion and cough.  If you don't have a humidifier, you can sit in the bathroom with the hot shower running.      For sore throat: try warm salt water gargles, cepacol lozenges, throat spray, warm tea or water with lemon/honey, popsicles or ice, or OTC cold relief medicine for throat discomfort.   For congestion: take a daily anti-histamine like Zyrtec, Claritin, and a oral decongestant, such as pseudoephedrine.  You can also use Flonase 1-2 sprays in each nostril daily.   It is important to stay hydrated: drink plenty of fluids (water, gatorade/powerade/pedialyte, juices, or teas) to keep your throat moisturized and help further relieve irritation/discomfort.    ED Prescriptions     Medication Sig Dispense Auth.  Provider   amoxicillin-clavulanate (AUGMENTIN) 875-125 MG tablet Take 1 tablet by mouth every 12 (twelve) hours. 14 tablet Nazly Digilio R, NP   predniSONE (STERAPRED UNI-PAK 21 TAB) 10 MG (21) TBPK tablet Take by mouth daily. Take 6 tabs by mouth daily  for 1 days, then 5 tabs for 1 days, then 4 tabs for 1 days, then 3 tabs for 1 days, 2 tabs for 1 days, then 1 tab by mouth daily for 1 days 21 tablet Donat Humble R, NP   benzonatate (TESSALON) 100 MG capsule Take 1 capsule (100 mg total) by mouth every 8 (eight) hours. 21 capsule Marcellius Montagna R, NP   promethazine-dextromethorphan (PROMETHAZINE-DM) 6.25-15 MG/5ML syrup Take 5 mLs by mouth at bedtime as needed for cough. 118 mL Henretta Quist, Elita Boone, NP      PDMP not reviewed this encounter.   Valinda Hoar, NP 02/23/23 1415

## 2023-02-23 NOTE — Discharge Instructions (Addendum)
On exam your left ear is affected therefore you will receive an antibiotic today, begin Augmentin every morning and every evening for 7 days  Low suspicion that you have pneumonia as your lungs are clear aside from a very mild wheeze to the right upper lung and you are getting enough air without assistance, wheezing is a sign of constriction and not of infection  For shortness of breath and wheezing begin prednisone every morning with food, take as directed to open and relax the airway  You may use Tessalon pill every 8 hours to help with coughing, may use cough syrup at bedtime for additional comfort  Right ear on exam is blocked completely by wax in water irrigation physical in completed by nursing staff to remove You can take Tylenol and/or Ibuprofen as needed for fever reduction and pain relief.   For cough: honey 1/2 to 1 teaspoon (you can dilute the honey in water or another fluid).  You can also use guaifenesin and dextromethorphan for cough. You can use a humidifier for chest congestion and cough.  If you don't have a humidifier, you can sit in the bathroom with the hot shower running.      For sore throat: try warm salt water gargles, cepacol lozenges, throat spray, warm tea or water with lemon/honey, popsicles or ice, or OTC cold relief medicine for throat discomfort.   For congestion: take a daily anti-histamine like Zyrtec, Claritin, and a oral decongestant, such as pseudoephedrine.  You can also use Flonase 1-2 sprays in each nostril daily.   It is important to stay hydrated: drink plenty of fluids (water, gatorade/powerade/pedialyte, juices, or teas) to keep your throat moisturized and help further relieve irritation/discomfort.

## 2023-02-23 NOTE — ED Triage Notes (Signed)
Patient to Urgent Care with complaints of fatigue/ dry cough/ sore throat/ headaches. Possible fevers.  Reports symptoms started four days ago. Negative for Covid and Flu at her work.  Exposed to walking pneumonia.   Has been taking theraflu/ dayquil/ Singulair and zyrtec.

## 2023-05-25 ENCOUNTER — Ambulatory Visit: Payer: BC Managed Care – PPO | Admitting: Podiatry

## 2023-05-25 ENCOUNTER — Encounter: Payer: Self-pay | Admitting: Podiatry

## 2023-05-25 DIAGNOSIS — L600 Ingrowing nail: Secondary | ICD-10-CM | POA: Diagnosis not present

## 2023-05-25 NOTE — Patient Instructions (Signed)
 Place 1/4 cup of epsom salts in a quart of warm tap water.  Submerge your foot or feet in the solution and soak for 20 minutes.  This soak should be done twice a day.  Next, remove your foot or feet from solution, blot dry the affected area. Apply antibiotic ointment and cover with Bandaid.  Change daily  IF YOUR SKIN BECOMES IRRITATED WHILE USING THESE INSTRUCTIONS, IT IS OKAY TO SWITCH TO  WHITE VINEGAR AND WATER.  As another alternative soak, you may use antibacterial soap and water.  Monitor for any signs/symptoms of infection. Call the office immediately if any occur or go directly to the emergency room. Call with any questions/concerns.

## 2023-05-25 NOTE — Progress Notes (Unsigned)
       Subjective:  Patient ID: Virginia Nichols, female    DOB: Jul 27, 1975,  MRN: [id number]  Virginia Nichols presents to clinic today for:  Chief Complaint  Patient presents with   Ingrown Toenail    LT 1st is ingrown. Ongoing for over a year. Has not had it removed before, but she did try and clip it herself. Some soreness when the nail gets long, denies drainage. Not diabetic, no anticoag.    Patient presents with concern of an ingrown toenail to the left great toenail along the lateral nail border.  States has been ongoing for over a year.  She has not had a previous procedure performed to this toe.  She has tried to clip it back herself.  She states is getting too tender to try to care for this at home anymore.  PCP is Gracelyn Nurse, MD.  No Known Allergies  Review of Systems: Negative except as noted in the HPI.  Objective:  Virginia Nichols is a pleasant 48 y.o. female in NAD. AAO x 3.  Vascular Examination: Capillary refill time is 3-5 seconds to toes bilateral. Palpable pedal pulses b/l LE. Digital hair present b/l. No pedal edema b/l. Skin temperature gradient WNL b/l. No varicosities b/l. No cyanosis or clubbing noted b/l.   Dermatological Examination: There is incurvation of the left hallux lateral nail border.  There is pain on palpation of the affected nail border.  No purulence or maceration is noted.  Neurological Examination: Epicritic sensation intact left foot  Assessment/Plan: 1. Ingrown toenail    Discussed patient's condition today.  After obtaining patient consent, the left hallux was anesthetized with a 50:50 mixture of 1% lidocaine plain and 0.5% bupivacaine plain for a total of 3cc's administered.  Upon confirmation of anesthesia, a freer elevator was utilized to free the left hallux lateral nail border from the nail bed.  The nail border was then avulsed proximal to the eponychium and removed in toto.  The area was inspected for any remaining  spicules.  A chemical matrixectomy was performed with NaOH and neutralized with acetic acid solution.  Antibiotic ointment and a DSD were applied, followed by a Coban dressing.  Patient tolerated the anesthetic and procedure well and will f/u in 2-3 weeks for recheck.  Patient given post-procedure instructions for daily 15-minute Epsom salt soaks, antibiotic ointment and daily use of Bandaids until toe starts to dry / form eschar.    Return in about 2 weeks (around 06/08/2023) for PNA recheck.   Clerance Lav, DPM, FACFAS [facility]     [address] [place], [place] [address]                Office [phone]  Fax [phone]

## 2023-06-08 ENCOUNTER — Ambulatory Visit: Admitting: Podiatry
